# Patient Record
Sex: Female | Born: 1970 | Race: White | Hispanic: No | Smoking: Never smoker
Health system: Southern US, Community
[De-identification: ages and names within clinical notes are randomized; demographics above are authoritative.]

---

## 2013-08-21 ENCOUNTER — Ambulatory Visit (INDEPENDENT_AMBULATORY_CARE_PROVIDER_SITE_OTHER): Payer: BC Managed Care – PPO

## 2013-08-21 ENCOUNTER — Encounter: Payer: Self-pay | Admitting: Sports Medicine

## 2013-08-21 ENCOUNTER — Ambulatory Visit (INDEPENDENT_AMBULATORY_CARE_PROVIDER_SITE_OTHER): Payer: BC Managed Care – PPO | Admitting: Sports Medicine

## 2013-08-21 VITALS — BP 119/72 | HR 80 | Ht 67.0 in | Wt 166.0 lb

## 2013-08-21 DIAGNOSIS — M25569 Pain in unspecified knee: Secondary | ICD-10-CM

## 2013-08-21 DIAGNOSIS — M25561 Pain in right knee: Secondary | ICD-10-CM | POA: Insufficient documentation

## 2013-08-21 MED ORDER — MELOXICAM 15 MG PO TABS: ORAL_TABLET | ORAL | Status: AC

## 2013-08-21 NOTE — Assessment & Plan Note (Signed)
Home rehabilitation, Mobic, x-rays. She does live in [place], and can find a sports doctor there to take over.

## 2013-08-21 NOTE — Progress Notes (Signed)
Patient ID: Sherri Simpson, female   DOB: 08-24-1970, 43 y.o.   MRN: [id number]   Subjective:    CC: Right Knee Pain   HPI: Pt is a 43 yo female w/ no significant PMH presenting today for right knee pain of 1 year duration.  She describes the pain as achy and she feels pops and grinding in her knee when she walks.  The pain occurs on the lateral aspects of her knee and sometimes underneath her knee cap.  The pain is worse in the mornings and gets better after she "gets going" during the day. She does claim however that the pain is worse after very long periods of activity. Her knee is stiff and more "crunchy" first thing in the morning as well.  Climbing stairs makes the pain worse, as does squatting to pick up her son.  She has not tried any medications or therapies to ease the pain.  She has not noticed any swelling, erythema, instability, or catching.  No history of trauma.  Past medical history, Surgical history, Family history not pertinant except as noted below, Social history, Allergies, and medications have been entered into the medical record, reviewed, and no changes needed.   Review of Systems: No headache, visual changes, nausea, vomiting, diarrhea, constipation, dizziness, abdominal pain, skin rash, fevers, chills, night sweats, weight loss, swollen lymph nodes, body aches, joint swelling, muscle aches, chest pain, shortness of breath, mood changes, visual or auditory hallucinations.   Objective:   General: Well Developed, well nourished, and in no acute distress.  Neuro/Psych: Alert and oriented x3, extra-ocular muscles intact, able to move all 4 extremities, sensation grossly intact. Skin: Warm and dry, no rashes noted.  Respiratory: Not using accessory muscles, speaking in full sentences, trachea midline.  Cardiovascular: Pulses palpable, no extremity edema. Abdomen: Does not appear distended. Right Knee: Normal to inspection with no erythema or effusion or obvious bony  abnormalities. Palpation normal with no warmth, or condyle tenderness. Tenderness present over the jointline bilaterally, mild patellar tenderness on palpation ROM full in flexion and extension and lower leg rotation. Ligaments with solid consistent endpoints including ACL, PCL, LCL, MCL. Negative Mcmurray's, Apley's, and Thessalonian tests. Non painful patellar compression. Patellar glide with reproducible crepitus. Patellar and quadriceps tendons unremarkable. Hamstring and quadriceps strength is normal.   X-ray show only mild medial joint space narrowing with a small spur in the notch from the medial femoral condyle  Impression and Recommendations:   This case required medical decision making of moderate complexity.  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  As she has not attempted any conservative therapy at home, we will begin with mobic and formal PT.  X-ray will be obtained to evaluate degree, if any, of arthritis in her knees.   -mobic - formal PT  - X-ray

## 2013-12-25 ENCOUNTER — Encounter: Payer: Self-pay | Admitting: Gynecology

## 2015-08-15 IMAGING — CR DG KNEE 1-2V*L*
4 series · 4 of 4 positions shown · non-contrast
Comparison: None.

CLINICAL DATA: Bilateral knee pain

EXAM:
LEFT KNEE - 1-2 VIEW

[view not recorded (1 of 4)]
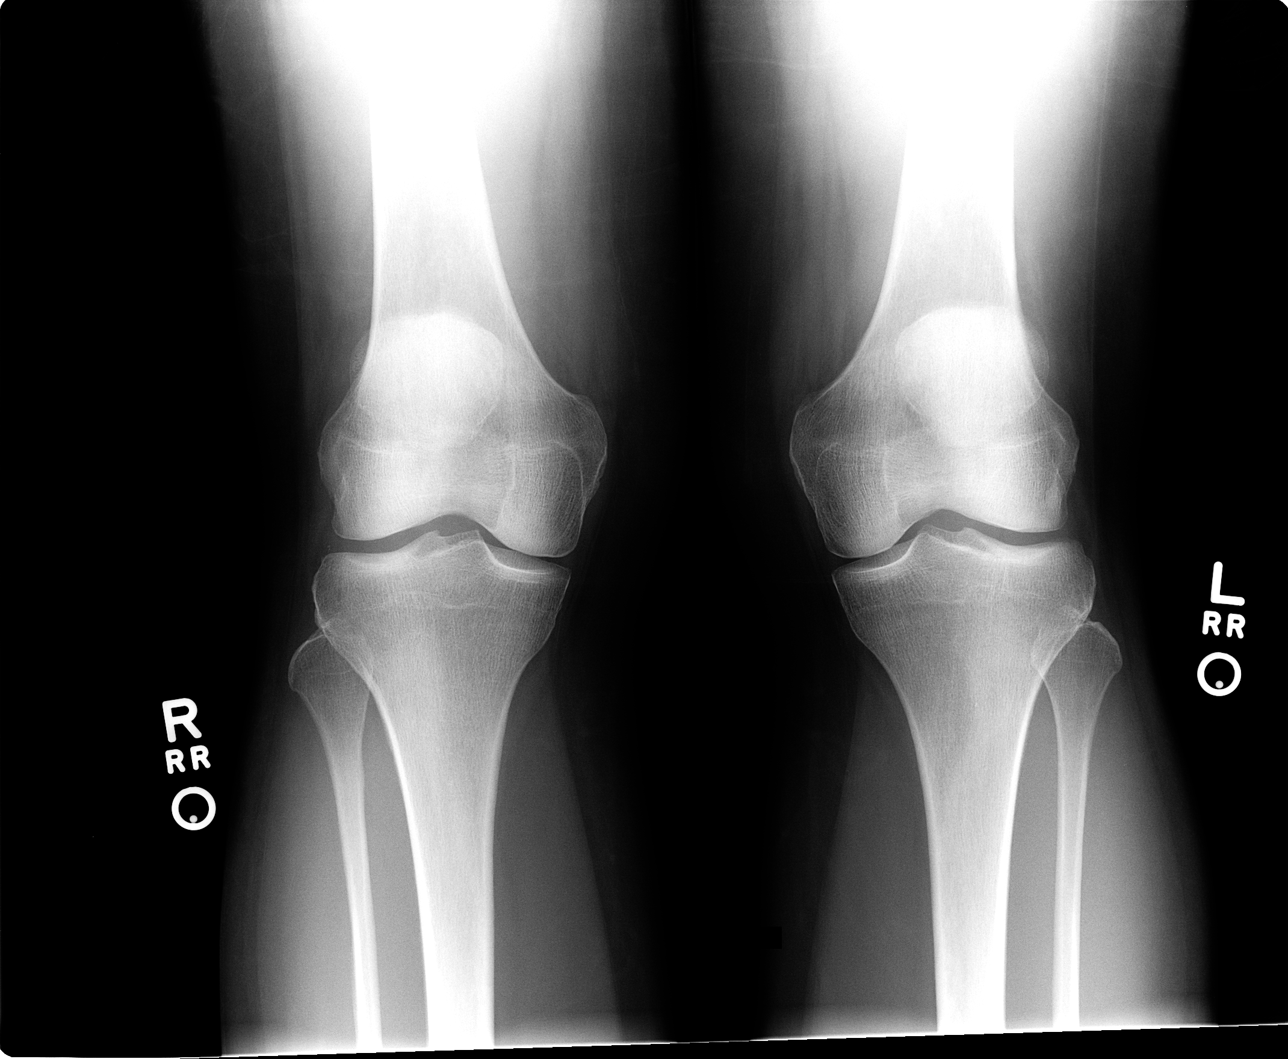

[view not recorded (2 of 4)]
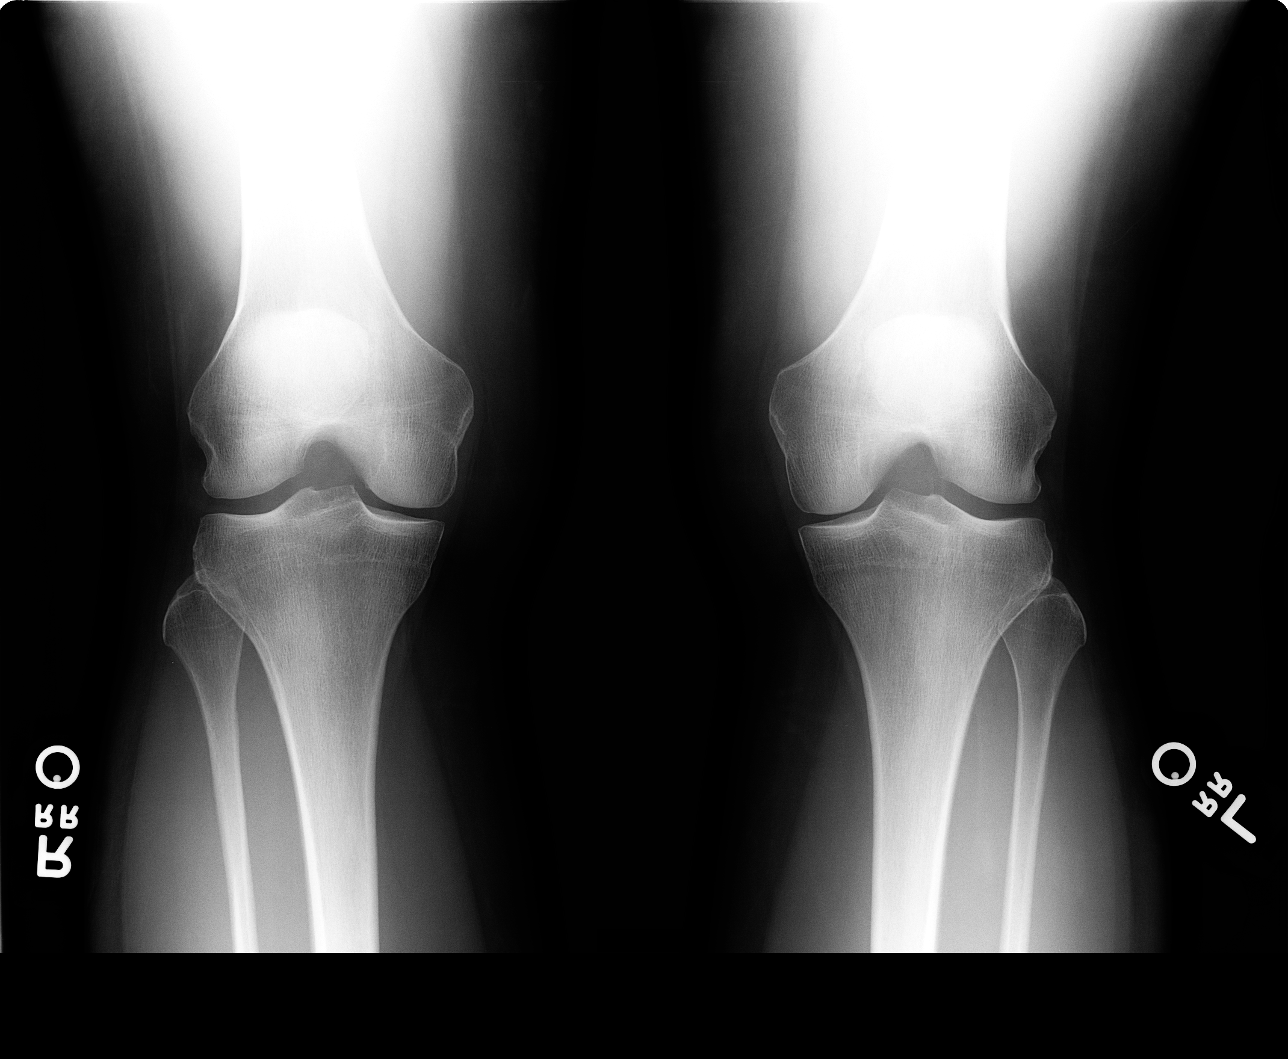

[view not recorded (3 of 4)]
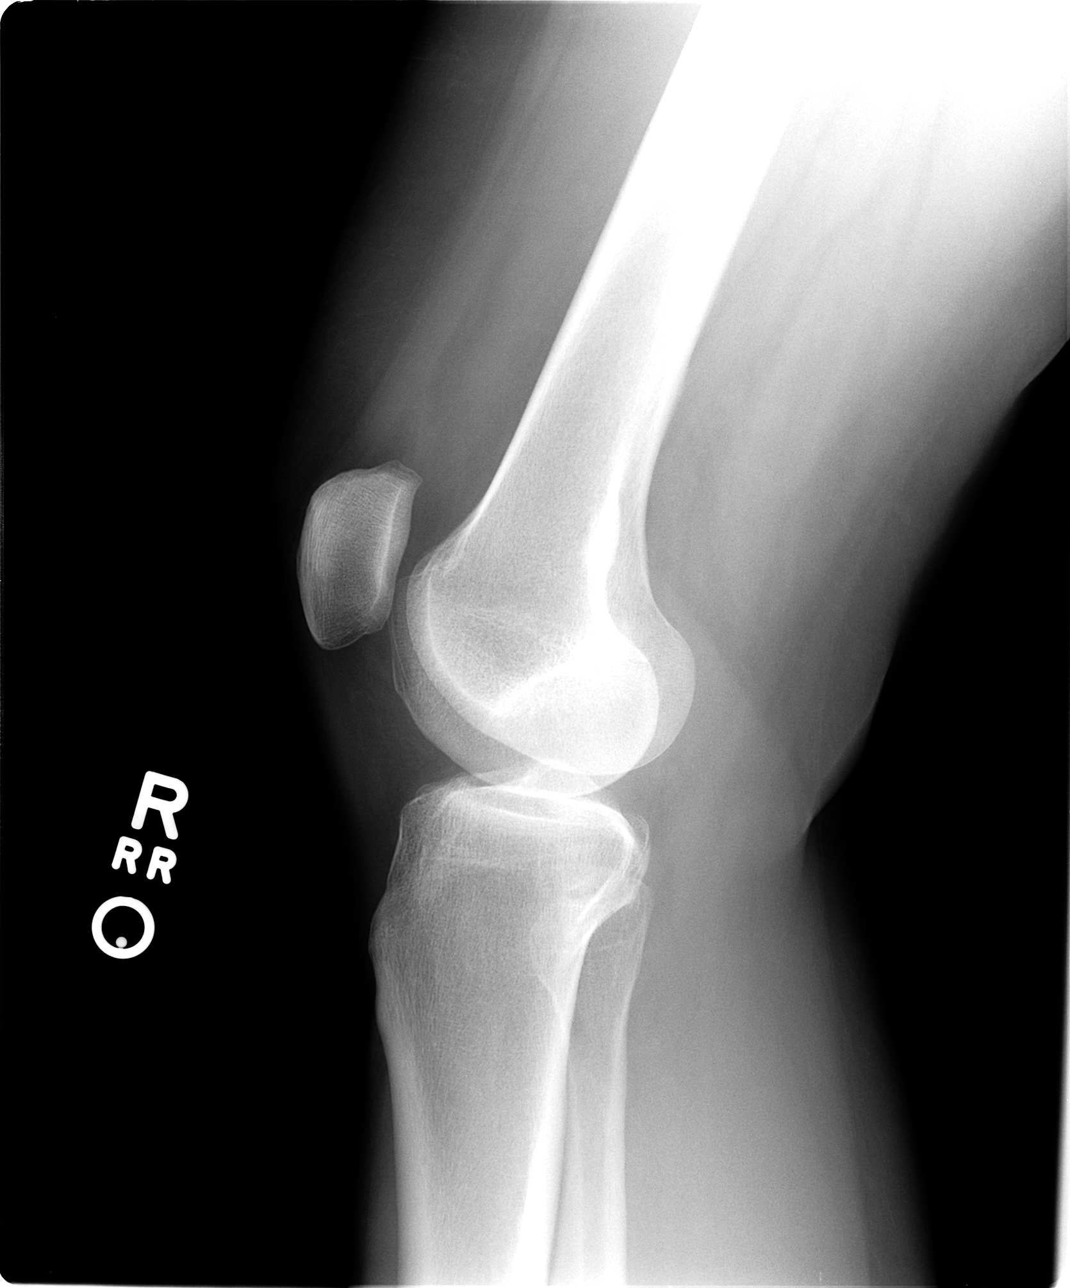

[view not recorded (4 of 4)]
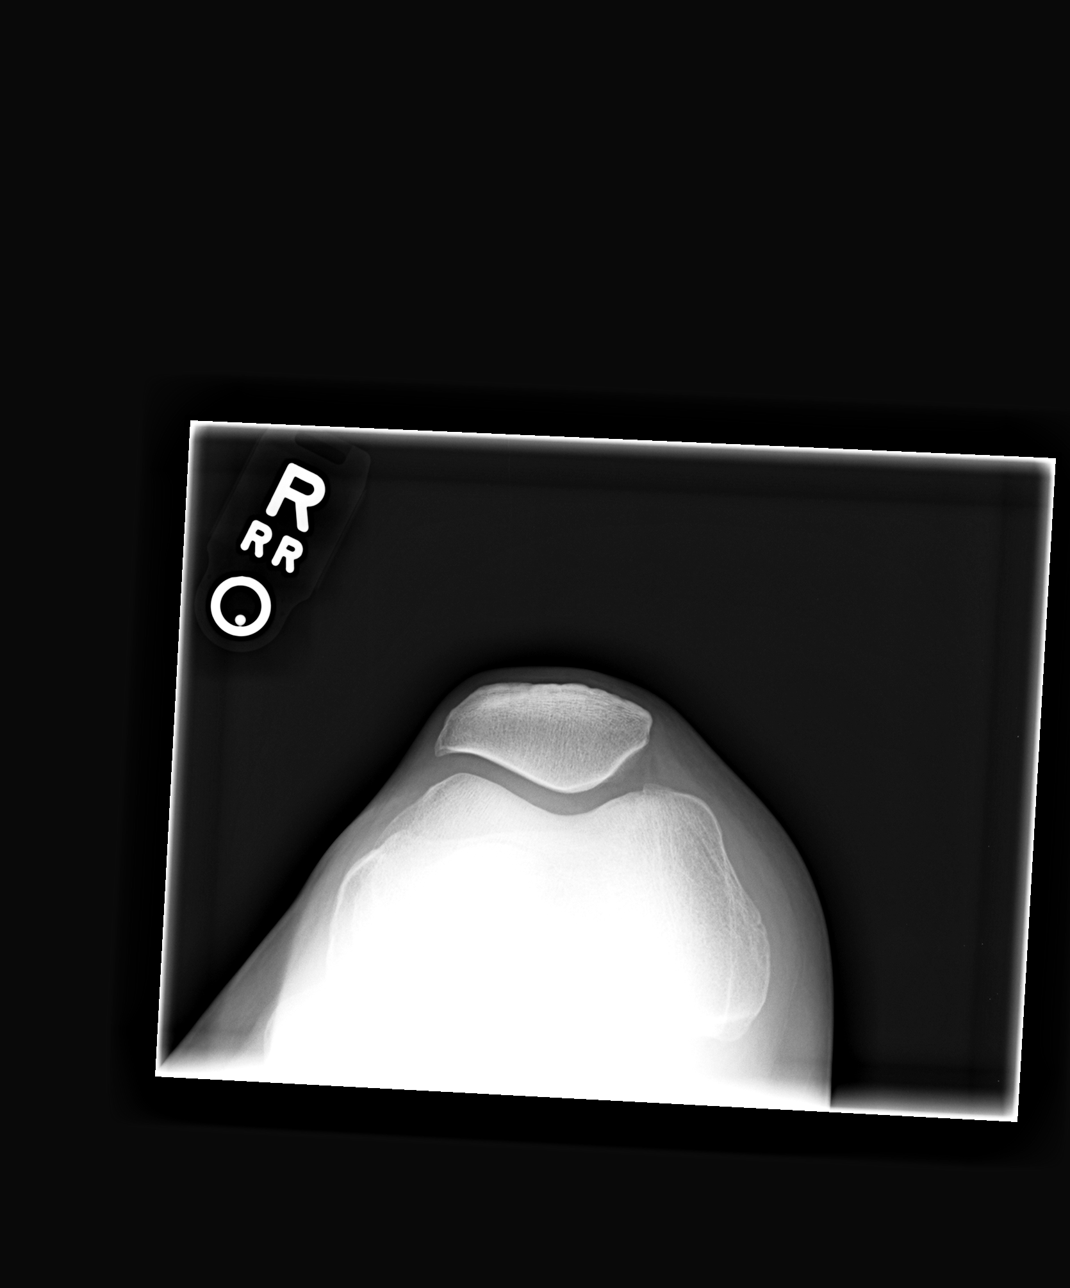

[4 of 4 positions shown; findings below may reference images not displayed]

FINDINGS: There is no evidence of fracture, dislocation, or joint effusion.
There is no evidence of arthropathy or other focal bone abnormality.
Soft tissues are unremarkable.
IMPRESSION: Negative.

## 2021-09-15 DIAGNOSIS — F3132 Bipolar disorder, current episode depressed, moderate: Secondary | ICD-10-CM | POA: Diagnosis not present

## 2021-09-15 DIAGNOSIS — F902 Attention-deficit hyperactivity disorder, combined type: Secondary | ICD-10-CM | POA: Diagnosis not present

## 2021-12-08 DIAGNOSIS — F902 Attention-deficit hyperactivity disorder, combined type: Secondary | ICD-10-CM | POA: Diagnosis not present

## 2021-12-08 DIAGNOSIS — F3132 Bipolar disorder, current episode depressed, moderate: Secondary | ICD-10-CM | POA: Diagnosis not present

## 2022-04-22 DIAGNOSIS — F902 Attention-deficit hyperactivity disorder, combined type: Secondary | ICD-10-CM | POA: Diagnosis not present

## 2022-04-22 DIAGNOSIS — F3175 Bipolar disorder, in partial remission, most recent episode depressed: Secondary | ICD-10-CM | POA: Diagnosis not present

## 2022-07-16 DIAGNOSIS — F3175 Bipolar disorder, in partial remission, most recent episode depressed: Secondary | ICD-10-CM | POA: Diagnosis not present

## 2022-07-16 DIAGNOSIS — F902 Attention-deficit hyperactivity disorder, combined type: Secondary | ICD-10-CM | POA: Diagnosis not present

## 2022-10-07 DIAGNOSIS — F902 Attention-deficit hyperactivity disorder, combined type: Secondary | ICD-10-CM | POA: Diagnosis not present

## 2022-10-07 DIAGNOSIS — F3175 Bipolar disorder, in partial remission, most recent episode depressed: Secondary | ICD-10-CM | POA: Diagnosis not present

## 2022-12-30 DIAGNOSIS — F902 Attention-deficit hyperactivity disorder, combined type: Secondary | ICD-10-CM | POA: Diagnosis not present

## 2022-12-30 DIAGNOSIS — F3175 Bipolar disorder, in partial remission, most recent episode depressed: Secondary | ICD-10-CM | POA: Diagnosis not present

## 2023-03-24 DIAGNOSIS — F3132 Bipolar disorder, current episode depressed, moderate: Secondary | ICD-10-CM | POA: Diagnosis not present

## 2023-03-24 DIAGNOSIS — F902 Attention-deficit hyperactivity disorder, combined type: Secondary | ICD-10-CM | POA: Diagnosis not present

## 2023-03-24 DIAGNOSIS — F3175 Bipolar disorder, in partial remission, most recent episode depressed: Secondary | ICD-10-CM | POA: Diagnosis not present

## 2023-06-03 DIAGNOSIS — F3175 Bipolar disorder, in partial remission, most recent episode depressed: Secondary | ICD-10-CM | POA: Diagnosis not present

## 2023-06-03 DIAGNOSIS — F3132 Bipolar disorder, current episode depressed, moderate: Secondary | ICD-10-CM | POA: Diagnosis not present

## 2023-06-03 DIAGNOSIS — F902 Attention-deficit hyperactivity disorder, combined type: Secondary | ICD-10-CM | POA: Diagnosis not present

## 2023-06-04 DIAGNOSIS — Z6835 Body mass index (BMI) 35.0-35.9, adult: Secondary | ICD-10-CM | POA: Diagnosis not present

## 2023-06-04 DIAGNOSIS — F3132 Bipolar disorder, current episode depressed, moderate: Secondary | ICD-10-CM | POA: Diagnosis not present

## 2023-06-04 DIAGNOSIS — Z79899 Other long term (current) drug therapy: Secondary | ICD-10-CM | POA: Diagnosis not present

## 2023-06-04 DIAGNOSIS — Z818 Family history of other mental and behavioral disorders: Secondary | ICD-10-CM | POA: Diagnosis not present

## 2023-06-04 DIAGNOSIS — I454 Nonspecific intraventricular block: Secondary | ICD-10-CM | POA: Diagnosis not present

## 2023-06-04 DIAGNOSIS — Z91148 Patient's other noncompliance with medication regimen for other reason: Secondary | ICD-10-CM | POA: Diagnosis not present

## 2023-06-04 DIAGNOSIS — F902 Attention-deficit hyperactivity disorder, combined type: Secondary | ICD-10-CM | POA: Diagnosis not present

## 2023-06-04 DIAGNOSIS — E669 Obesity, unspecified: Secondary | ICD-10-CM | POA: Diagnosis not present

## 2023-06-04 DIAGNOSIS — F312 Bipolar disorder, current episode manic severe with psychotic features: Secondary | ICD-10-CM | POA: Diagnosis not present

## 2023-06-04 DIAGNOSIS — R7401 Elevation of levels of liver transaminase levels: Secondary | ICD-10-CM | POA: Diagnosis not present

## 2023-06-04 DIAGNOSIS — I10 Essential (primary) hypertension: Secondary | ICD-10-CM | POA: Diagnosis not present

## 2023-06-04 DIAGNOSIS — E119 Type 2 diabetes mellitus without complications: Secondary | ICD-10-CM | POA: Diagnosis not present

## 2023-06-04 DIAGNOSIS — Z91199 Patient's noncompliance with other medical treatment and regimen due to unspecified reason: Secondary | ICD-10-CM | POA: Diagnosis not present

## 2023-06-04 DIAGNOSIS — E782 Mixed hyperlipidemia: Secondary | ICD-10-CM | POA: Diagnosis not present

## 2023-06-04 DIAGNOSIS — Z885 Allergy status to narcotic agent status: Secondary | ICD-10-CM | POA: Diagnosis not present

## 2023-06-04 DIAGNOSIS — F1220 Cannabis dependence, uncomplicated: Secondary | ICD-10-CM | POA: Diagnosis not present

## 2023-06-10 DIAGNOSIS — F902 Attention-deficit hyperactivity disorder, combined type: Secondary | ICD-10-CM | POA: Diagnosis not present

## 2023-06-10 DIAGNOSIS — F3132 Bipolar disorder, current episode depressed, moderate: Secondary | ICD-10-CM | POA: Diagnosis not present

## 2023-06-22 DIAGNOSIS — F319 Bipolar disorder, unspecified: Secondary | ICD-10-CM | POA: Diagnosis not present

## 2023-06-22 DIAGNOSIS — F1220 Cannabis dependence, uncomplicated: Secondary | ICD-10-CM | POA: Diagnosis not present

## 2023-06-22 DIAGNOSIS — K219 Gastro-esophageal reflux disease without esophagitis: Secondary | ICD-10-CM | POA: Diagnosis not present

## 2023-06-23 DIAGNOSIS — F3132 Bipolar disorder, current episode depressed, moderate: Secondary | ICD-10-CM | POA: Diagnosis not present

## 2023-06-23 DIAGNOSIS — F902 Attention-deficit hyperactivity disorder, combined type: Secondary | ICD-10-CM | POA: Diagnosis not present

## 2023-06-24 DIAGNOSIS — Z01419 Encounter for gynecological examination (general) (routine) without abnormal findings: Secondary | ICD-10-CM | POA: Diagnosis not present

## 2023-06-24 DIAGNOSIS — Z124 Encounter for screening for malignant neoplasm of cervix: Secondary | ICD-10-CM | POA: Diagnosis not present

## 2023-06-24 DIAGNOSIS — Z1231 Encounter for screening mammogram for malignant neoplasm of breast: Secondary | ICD-10-CM | POA: Diagnosis not present

## 2023-06-24 DIAGNOSIS — Z6834 Body mass index (BMI) 34.0-34.9, adult: Secondary | ICD-10-CM | POA: Diagnosis not present

## 2023-06-24 DIAGNOSIS — Z1151 Encounter for screening for human papillomavirus (HPV): Secondary | ICD-10-CM | POA: Diagnosis not present

## 2023-06-24 DIAGNOSIS — N951 Menopausal and female climacteric states: Secondary | ICD-10-CM | POA: Diagnosis not present

## 2023-06-30 DIAGNOSIS — L821 Other seborrheic keratosis: Secondary | ICD-10-CM | POA: Diagnosis not present

## 2023-06-30 DIAGNOSIS — D229 Melanocytic nevi, unspecified: Secondary | ICD-10-CM | POA: Diagnosis not present

## 2023-06-30 DIAGNOSIS — L814 Other melanin hyperpigmentation: Secondary | ICD-10-CM | POA: Diagnosis not present

## 2023-06-30 DIAGNOSIS — L700 Acne vulgaris: Secondary | ICD-10-CM | POA: Diagnosis not present

## 2023-07-12 DIAGNOSIS — F3132 Bipolar disorder, current episode depressed, moderate: Secondary | ICD-10-CM | POA: Diagnosis not present

## 2023-07-12 DIAGNOSIS — F902 Attention-deficit hyperactivity disorder, combined type: Secondary | ICD-10-CM | POA: Diagnosis not present

## 2023-07-28 DIAGNOSIS — F3132 Bipolar disorder, current episode depressed, moderate: Secondary | ICD-10-CM | POA: Diagnosis not present

## 2023-07-28 DIAGNOSIS — F902 Attention-deficit hyperactivity disorder, combined type: Secondary | ICD-10-CM | POA: Diagnosis not present

## 2023-08-03 DIAGNOSIS — I10 Essential (primary) hypertension: Secondary | ICD-10-CM | POA: Diagnosis not present

## 2023-08-03 DIAGNOSIS — F3132 Bipolar disorder, current episode depressed, moderate: Secondary | ICD-10-CM | POA: Diagnosis not present

## 2023-08-03 DIAGNOSIS — E119 Type 2 diabetes mellitus without complications: Secondary | ICD-10-CM | POA: Diagnosis not present

## 2023-08-03 DIAGNOSIS — Z13220 Encounter for screening for lipoid disorders: Secondary | ICD-10-CM | POA: Diagnosis not present

## 2023-08-03 DIAGNOSIS — Z0000 Encounter for general adult medical examination without abnormal findings: Secondary | ICD-10-CM | POA: Diagnosis not present

## 2023-08-03 DIAGNOSIS — F902 Attention-deficit hyperactivity disorder, combined type: Secondary | ICD-10-CM | POA: Diagnosis not present

## 2023-08-06 DIAGNOSIS — F902 Attention-deficit hyperactivity disorder, combined type: Secondary | ICD-10-CM | POA: Diagnosis not present

## 2023-08-06 DIAGNOSIS — F3132 Bipolar disorder, current episode depressed, moderate: Secondary | ICD-10-CM | POA: Diagnosis not present

## 2023-08-20 DIAGNOSIS — F902 Attention-deficit hyperactivity disorder, combined type: Secondary | ICD-10-CM | POA: Diagnosis not present

## 2023-08-20 DIAGNOSIS — F3132 Bipolar disorder, current episode depressed, moderate: Secondary | ICD-10-CM | POA: Diagnosis not present

## 2023-09-28 DIAGNOSIS — F902 Attention-deficit hyperactivity disorder, combined type: Secondary | ICD-10-CM | POA: Diagnosis not present

## 2023-09-28 DIAGNOSIS — F3132 Bipolar disorder, current episode depressed, moderate: Secondary | ICD-10-CM | POA: Diagnosis not present

## 2023-10-12 DIAGNOSIS — F902 Attention-deficit hyperactivity disorder, combined type: Secondary | ICD-10-CM | POA: Diagnosis not present

## 2023-10-12 DIAGNOSIS — F3132 Bipolar disorder, current episode depressed, moderate: Secondary | ICD-10-CM | POA: Diagnosis not present

## 2023-12-07 DIAGNOSIS — I10 Essential (primary) hypertension: Secondary | ICD-10-CM | POA: Diagnosis not present

## 2023-12-07 DIAGNOSIS — R531 Weakness: Secondary | ICD-10-CM | POA: Diagnosis not present

## 2023-12-07 DIAGNOSIS — Z7984 Long term (current) use of oral hypoglycemic drugs: Secondary | ICD-10-CM | POA: Diagnosis not present

## 2023-12-07 DIAGNOSIS — F319 Bipolar disorder, unspecified: Secondary | ICD-10-CM | POA: Diagnosis not present

## 2023-12-07 DIAGNOSIS — R000 Tachycardia, unspecified: Secondary | ICD-10-CM | POA: Diagnosis not present

## 2023-12-07 DIAGNOSIS — Z635 Disruption of family by separation and divorce: Secondary | ICD-10-CM | POA: Diagnosis not present

## 2023-12-07 DIAGNOSIS — E119 Type 2 diabetes mellitus without complications: Secondary | ICD-10-CM | POA: Diagnosis not present

## 2023-12-07 DIAGNOSIS — F314 Bipolar disorder, current episode depressed, severe, without psychotic features: Secondary | ICD-10-CM | POA: Diagnosis not present

## 2023-12-07 DIAGNOSIS — Z91148 Patient's other noncompliance with medication regimen for other reason: Secondary | ICD-10-CM | POA: Diagnosis not present

## 2023-12-07 DIAGNOSIS — F17290 Nicotine dependence, other tobacco product, uncomplicated: Secondary | ICD-10-CM | POA: Diagnosis not present

## 2023-12-07 DIAGNOSIS — E7800 Pure hypercholesterolemia, unspecified: Secondary | ICD-10-CM | POA: Diagnosis not present

## 2023-12-07 DIAGNOSIS — Z79899 Other long term (current) drug therapy: Secondary | ICD-10-CM | POA: Diagnosis not present

## 2023-12-07 DIAGNOSIS — N390 Urinary tract infection, site not specified: Secondary | ICD-10-CM | POA: Diagnosis not present

## 2023-12-07 DIAGNOSIS — F3164 Bipolar disorder, current episode mixed, severe, with psychotic features: Secondary | ICD-10-CM | POA: Diagnosis not present

## 2023-12-07 DIAGNOSIS — Z638 Other specified problems related to primary support group: Secondary | ICD-10-CM | POA: Diagnosis not present

## 2023-12-07 DIAGNOSIS — Z59811 Housing instability, housed, with risk of homelessness: Secondary | ICD-10-CM | POA: Diagnosis not present

## 2023-12-07 DIAGNOSIS — F32A Depression, unspecified: Secondary | ICD-10-CM | POA: Diagnosis not present

## 2024-01-04 DIAGNOSIS — F3132 Bipolar disorder, current episode depressed, moderate: Secondary | ICD-10-CM | POA: Diagnosis not present

## 2024-01-04 DIAGNOSIS — F902 Attention-deficit hyperactivity disorder, combined type: Secondary | ICD-10-CM | POA: Diagnosis not present

## 2024-02-22 DIAGNOSIS — N951 Menopausal and female climacteric states: Secondary | ICD-10-CM | POA: Diagnosis not present

## 2024-02-28 DIAGNOSIS — F3132 Bipolar disorder, current episode depressed, moderate: Secondary | ICD-10-CM | POA: Diagnosis not present

## 2024-02-28 DIAGNOSIS — F902 Attention-deficit hyperactivity disorder, combined type: Secondary | ICD-10-CM | POA: Diagnosis not present
